# Patient Record
Sex: Female | Born: 1955 | Race: White | Hispanic: No | Smoking: Current some day smoker
Health system: Southern US, Community
[De-identification: ages and names within clinical notes are randomized; demographics above are authoritative.]

## PROBLEM LIST (undated history)

## (undated) DIAGNOSIS — F419 Anxiety disorder, unspecified: Secondary | ICD-10-CM

## (undated) DIAGNOSIS — I10 Essential (primary) hypertension: Secondary | ICD-10-CM

## (undated) DIAGNOSIS — M069 Rheumatoid arthritis, unspecified: Secondary | ICD-10-CM

## (undated) HISTORY — PX: ABDOMINAL HYSTERECTOMY: SHX81

## (undated) HISTORY — PX: WISDOM TOOTH EXTRACTION: SHX21

## (undated) HISTORY — PX: TONSILLECTOMY: SUR1361

## (undated) HISTORY — PX: BACK SURGERY: SHX140

## (undated) HISTORY — PX: ROTATOR CUFF REPAIR: SHX139

---

## 2014-10-12 ENCOUNTER — Emergency Department (HOSPITAL_BASED_OUTPATIENT_CLINIC_OR_DEPARTMENT_OTHER)
Admission: EM | Admit: 2014-10-12 | Discharge: 2014-10-12 | Disposition: A | Discharge status: Home / Self Care | Payer: PRIVATE HEALTH INSURANCE | Attending: Emergency Medicine | Admitting: Emergency Medicine

## 2014-10-12 ENCOUNTER — Encounter (HOSPITAL_BASED_OUTPATIENT_CLINIC_OR_DEPARTMENT_OTHER): Payer: Self-pay | Admitting: *Deleted

## 2014-10-12 ENCOUNTER — Emergency Department (HOSPITAL_BASED_OUTPATIENT_CLINIC_OR_DEPARTMENT_OTHER): Payer: PRIVATE HEALTH INSURANCE

## 2014-10-12 DIAGNOSIS — I10 Essential (primary) hypertension: Secondary | ICD-10-CM | POA: Diagnosis not present

## 2014-10-12 DIAGNOSIS — Z720 Tobacco use: Secondary | ICD-10-CM | POA: Diagnosis not present

## 2014-10-12 DIAGNOSIS — R238 Other skin changes: Secondary | ICD-10-CM

## 2014-10-12 DIAGNOSIS — Z8739 Personal history of other diseases of the musculoskeletal system and connective tissue: Secondary | ICD-10-CM | POA: Diagnosis not present

## 2014-10-12 DIAGNOSIS — R059 Cough, unspecified: Secondary | ICD-10-CM

## 2014-10-12 DIAGNOSIS — F419 Anxiety disorder, unspecified: Secondary | ICD-10-CM | POA: Diagnosis not present

## 2014-10-12 DIAGNOSIS — R55 Syncope and collapse: Secondary | ICD-10-CM | POA: Diagnosis not present

## 2014-10-12 DIAGNOSIS — Z79899 Other long term (current) drug therapy: Secondary | ICD-10-CM | POA: Diagnosis not present

## 2014-10-12 DIAGNOSIS — R229 Localized swelling, mass and lump, unspecified: Secondary | ICD-10-CM | POA: Insufficient documentation

## 2014-10-12 DIAGNOSIS — R05 Cough: Secondary | ICD-10-CM | POA: Insufficient documentation

## 2014-10-12 DIAGNOSIS — R110 Nausea: Secondary | ICD-10-CM | POA: Diagnosis present

## 2014-10-12 HISTORY — DX: Essential (primary) hypertension: I10

## 2014-10-12 HISTORY — DX: Rheumatoid arthritis, unspecified: M06.9

## 2014-10-12 HISTORY — DX: Anxiety disorder, unspecified: F41.9

## 2014-10-12 LAB — CBC WITH DIFFERENTIAL/PLATELET
BASOS ABS: 0 10*3/uL (ref 0.0–0.1)
BASOS PCT: 0 % (ref 0–1)
EOS ABS: 0.1 10*3/uL (ref 0.0–0.7)
EOS PCT: 1 % (ref 0–5)
HCT: 38.7 % (ref 36.0–46.0)
Hemoglobin: 12.9 g/dL (ref 12.0–15.0)
LYMPHS PCT: 11 % — AB (ref 12–46)
Lymphs Abs: 1.2 10*3/uL (ref 0.7–4.0)
MCH: 32.1 pg (ref 26.0–34.0)
MCHC: 33.3 g/dL (ref 30.0–36.0)
MCV: 96.3 fL (ref 78.0–100.0)
MONO ABS: 0.4 10*3/uL (ref 0.1–1.0)
MONOS PCT: 4 % (ref 3–12)
NEUTROS ABS: 9.2 10*3/uL — AB (ref 1.7–7.7)
Neutrophils Relative %: 84 % — ABNORMAL HIGH (ref 43–77)
Platelets: 300 10*3/uL (ref 150–400)
RBC: 4.02 MIL/uL (ref 3.87–5.11)
RDW: 12.2 % (ref 11.5–15.5)
WBC: 10.8 10*3/uL — ABNORMAL HIGH (ref 4.0–10.5)

## 2014-10-12 LAB — URINALYSIS, ROUTINE W REFLEX MICROSCOPIC
Bilirubin Urine: NEGATIVE
GLUCOSE, UA: NEGATIVE mg/dL
HGB URINE DIPSTICK: NEGATIVE
KETONES UR: 15 mg/dL — AB
LEUKOCYTES UA: NEGATIVE
NITRITE: NEGATIVE
PH: 8 (ref 5.0–8.0)
PROTEIN: NEGATIVE mg/dL
SPECIFIC GRAVITY, URINE: 1.018 (ref 1.005–1.030)
Urobilinogen, UA: 1 mg/dL (ref 0.0–1.0)

## 2014-10-12 LAB — BASIC METABOLIC PANEL
Anion gap: 3 — ABNORMAL LOW (ref 5–15)
BUN: 13 mg/dL (ref 6–23)
CO2: 29 mmol/L (ref 19–32)
CREATININE: 0.57 mg/dL (ref 0.50–1.10)
Calcium: 8.8 mg/dL (ref 8.4–10.5)
Chloride: 99 mmol/L (ref 96–112)
GFR calc Af Amer: >90 mL/min (ref >90)
GFR calc non Af Amer: >90 mL/min (ref >90)
GLUCOSE: 117 mg/dL — AB (ref 70–99)
POTASSIUM: 3.8 mmol/L (ref 3.5–5.1)
Sodium: 131 mmol/L — ABNORMAL LOW (ref 135–145)

## 2014-10-12 MED ORDER — SODIUM CHLORIDE 0.9 % IV BOLUS (SEPSIS)
1000.0000 mL | Freq: Once | INTRAVENOUS | Status: AC
  Administered 2014-10-12: 1000 mL via INTRAVENOUS

## 2014-10-12 NOTE — ED Notes (Addendum)
Pt reports she has had several episodes of feeling hot and flushed, clammy, nausea, since last night- last episode happened while waiting for triage- reports she has a headache and feels like she is going to pass out during the episodes

## 2014-10-12 NOTE — Discharge Instructions (Signed)
Maintaining her hydration, follow with your primary care doctor and obtain OB/GYN.   Please follow with your primary care doctor in the next 2 days for a check-up. They must obtain records for further management.   Do not hesitate to return to the Emergency Department for any new, worsening or concerning symptoms.

## 2014-10-12 NOTE — ED Provider Notes (Signed)
CSN: [id number]     Arrival date & time 10/12/14  1435 History   First MD Initiated Contact with Patient 10/12/14 1503     Chief Complaint  Patient presents with  . Nausea     (Consider location/radiation/quality/duration/timing/severity/associated sxs/prior Treatment) HPI   Tara Woodard is a 59 y.o. female past medical history significant for hypertension, anxiety and rheumatoid arthritis complaining of 4-5 episodes of lightheadedness, nausea, flushing lasting several seconds, she feels presyncopal but she is not pass out. The first episode was last night as she was going to bed, second episode was this afternoon while she was waiting in line at the drugstore. This is not related to standing up. Patient states that she recently started doing a infrared treatments for her rheumatoid arthritis, she's had 3 episodes this week. She is eating and drinking normally, she denies chest pain, shortness of breath, nausea, vomiting, change in bowel or bladder habits. On review of systems she notes a dry cough which she's had since December, attributes this to a persistent cold with rhinorrhea.  Past Medical History  Diagnosis Date  . Hypertension   . Anxiety   . RA (rheumatoid arthritis)    Past Surgical History  Procedure Laterality Date  . Back surgery    . Rotator cuff repair    . Abdominal hysterectomy    . Tonsillectomy    . Wisdom tooth extraction     No family history on file. History  Substance Use Topics  . Smoking status: Current Some Day Smoker    Types: Cigarettes  . Smokeless tobacco: Never Used  . Alcohol Use: 3.0 oz/week    5 Glasses of wine per week   OB History    No data available     Review of Systems  10 systems reviewed and found to be negative, except as noted in the HPI.   Allergies  Keflex and Augmentin  Home Medications   Prior to Admission medications   Medication Sig Start Date End Date Taking? Authorizing Provider   Amphetamine-Dextroamphetamine (ADDERALL PO) Take by mouth.   Yes Historical Provider, MD  busPIRone (BUSPAR) 10 MG tablet Take 10 mg by mouth daily.   Yes Historical Provider, MD  citalopram (CELEXA) 10 MG tablet Take 10 mg by mouth daily.   Yes Historical Provider, MD  lisinopril-hydrochlorothiazide (PRINZIDE,ZESTORETIC) 10-12.5 MG per tablet Take 1 tablet by mouth daily.   Yes Historical Provider, MD  OXYCODONE HCL PO Take 15 mg by mouth as needed.   Yes Historical Provider, MD   BP 109/63 mmHg  Pulse 78  Temp(Src) 98.4 F (36.9 C) (Oral)  Resp 14  Ht 5' 5.5" (1.664 m)  Wt 140 lb (63.504 kg)  BMI 22.93 kg/m2  SpO2 100% Physical Exam  Constitutional: She is oriented to person, place, and time. She appears well-developed and well-nourished. No distress.  HENT:  Head: Normocephalic and atraumatic.  Mouth/Throat: Oropharynx is clear and moist.  Eyes: Conjunctivae and EOM are normal. Pupils are equal, round, and reactive to light.  Neck: Normal range of motion.  Cardiovascular: Normal rate, regular rhythm and intact distal pulses.   Pulmonary/Chest: Effort normal and breath sounds normal. No stridor. No respiratory distress. She has no wheezes. She has no rales. She exhibits no tenderness.  Abdominal: Soft. Bowel sounds are normal. She exhibits no distension and no mass. There is no tenderness. There is no rebound and no guarding.  Musculoskeletal: Normal range of motion. She exhibits no edema or tenderness.  No  calf asymmetry, superficial collaterals, palpable cords, edema, Homans sign negative bilaterally.    Neurological: She is alert and oriented to person, place, and time.  Psychiatric: She has a normal mood and affect.  Nursing note and vitals reviewed.   ED Course  Procedures (including critical care time) Labs Review Labs Reviewed  CBC WITH DIFFERENTIAL/PLATELET - Abnormal; Notable for the following:    WBC 10.8 (*)    Neutrophils Relative % 84 (*)    Neutro Abs 9.2 (*)     Lymphocytes Relative 11 (*)    All other components within normal limits  BASIC METABOLIC PANEL - Abnormal; Notable for the following:    Sodium 131 (*)    Glucose, Bld 117 (*)    Anion gap 3 (*)    All other components within normal limits  URINALYSIS, ROUTINE W REFLEX MICROSCOPIC - Abnormal; Notable for the following:    Ketones, ur 15 (*)    All other components within normal limits  URINE CULTURE    Imaging Review Dg Chest 2 View  10/12/2014   CLINICAL DATA:  Several episodes of feeling hot, flushed, clammy, nauseated since last night. History of hypertension.  EXAM: CHEST  2 VIEW  COMPARISON:  None.  FINDINGS: The heart size and mediastinal contours are within normal limits. Both lungs are clear. The visualized skeletal structures are unremarkable.  IMPRESSION: No active cardiopulmonary disease.   Electronically Signed   By: Norva PavlovElizabeth  Brown M.D.   On: 10/12/2014 16:22     EKG Interpretation None      MDM   Final diagnoses:  Cough  Localized warmth of skin  Pre-syncope    Filed Vitals:   10/12/14 1524 10/12/14 1525 10/12/14 1525 10/12/14 1816  BP: 118/67 118/80 106/69 109/63  Pulse: 80 81 87 78  Temp:      TempSrc:      Resp:    14  Height:      Weight:      SpO2:    100%    Medications  sodium chloride 0.9 % bolus 1,000 mL (0 mLs Intravenous Stopped 10/12/14 1902)    Tara PartridgeKimberly M Woodard is a pleasant 59 y.o. female presenting with episodes of extreme warmth and flushing, HA and nausea with presyncopal sensation. No chest pain/sob/LOC. EKG with LBBB, no prior for comparison, Pt has had several episodes in ED and lasting a few minutes with flushing to face and upper chest. Review of cardiac monitoring during 2 episodes with no abnormalities (reviewed with attending). Pt has had hysterectomy, but may be perimenopausal with hot flashes. Extensive discussion of return precautions and Pt verbalizes understanding. Will follow with pcp.   Discussed case with attending  MD who agrees with plan and stability to d/c to home.   Evaluation does not show pathology that would require ongoing emergent intervention or inpatient treatment. Pt is hemodynamically stable and mentating appropriately. Discussed findings and plan with patient/guardian, who agrees with care plan. All questions answered. Return precautions discussed and outpatient follow up given.   Discharge Medication List as of 10/12/2014  6:36 PM           Wynetta Emeryicole Abagael Kramm, PA-C 10/13/14 1702  Juliet RudeNathan R. Rubin PayorPickering, MD 10/14/14 2133

## 2014-10-13 LAB — URINE CULTURE: Colony Count: 10000

## 2015-10-21 IMAGING — DX DG CHEST 2V
2 series · 2 of 2 positions shown · non-contrast
Comparison: None.

CLINICAL DATA: Several episodes of feeling hot, flushed, clammy,
nauseated since last night. History of hypertension.

EXAM:
CHEST  2 VIEW

[chest pa]
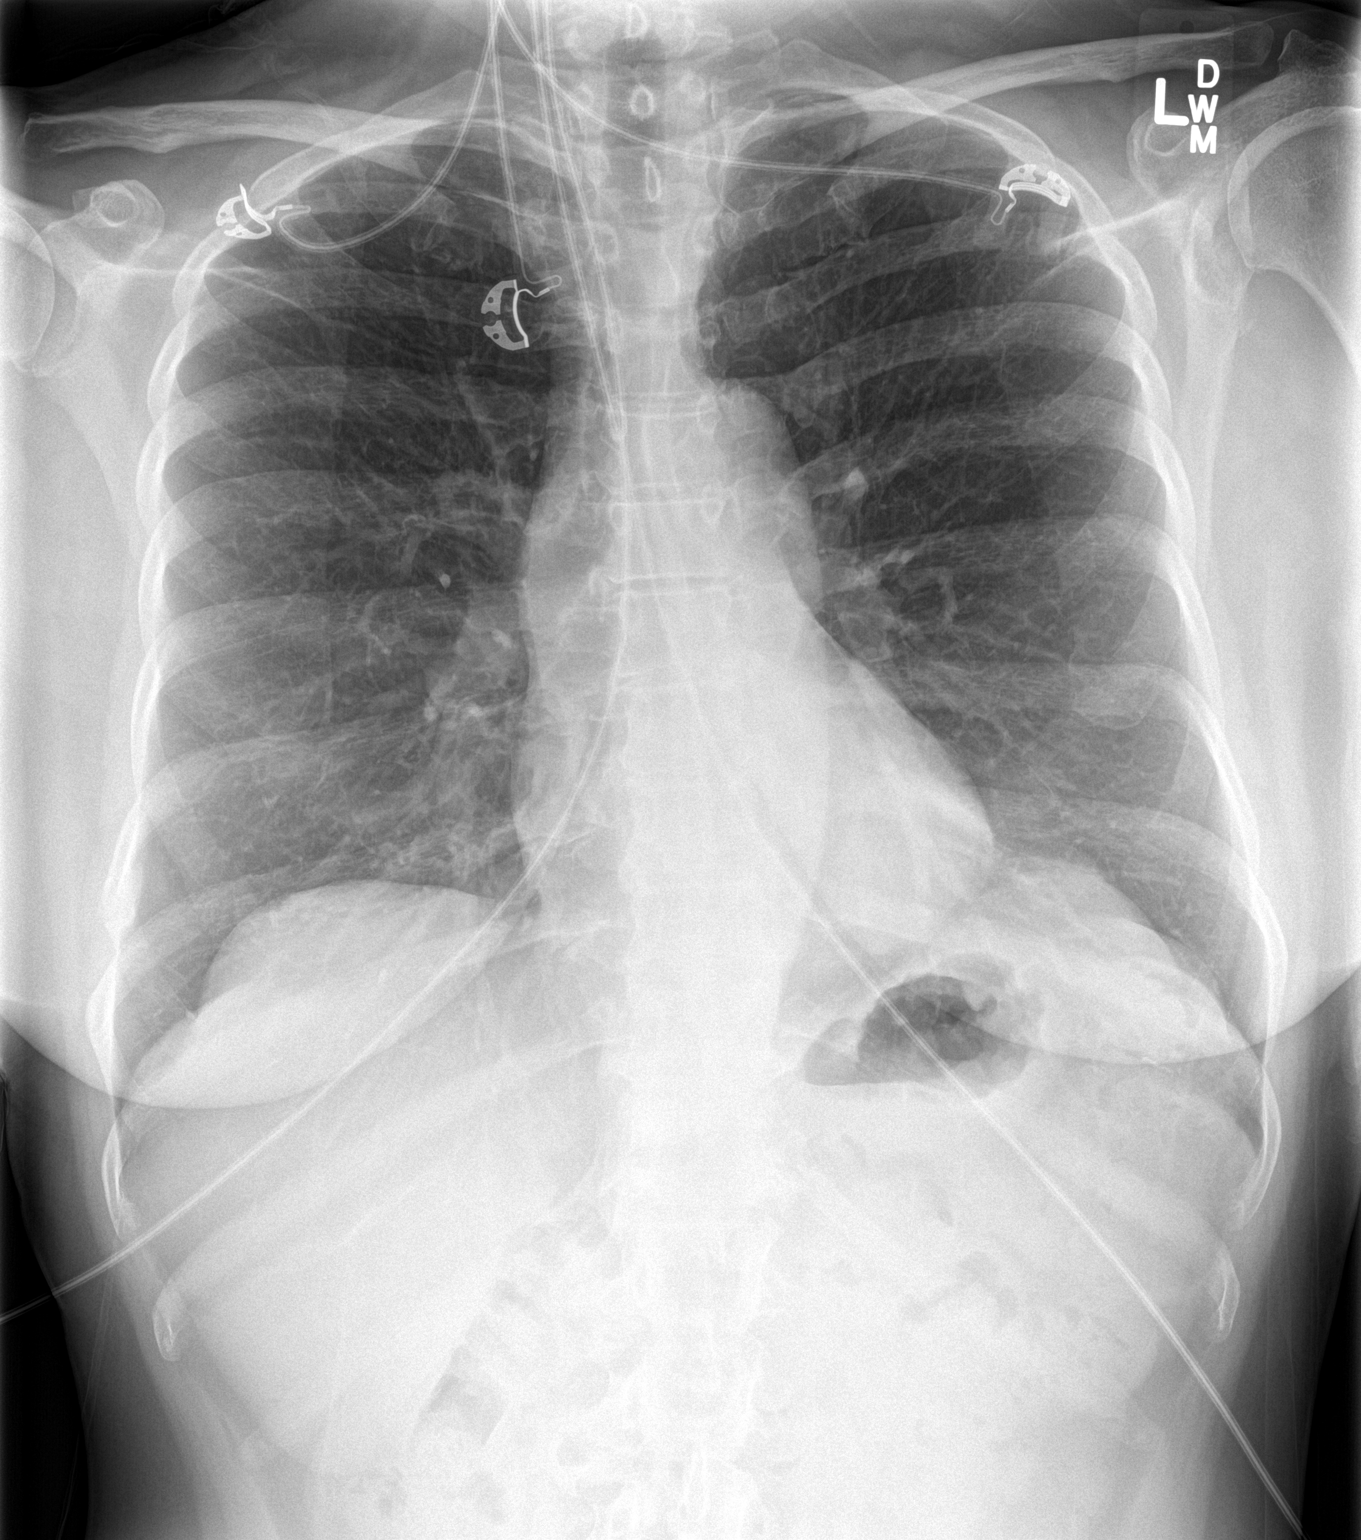

[chest lat]
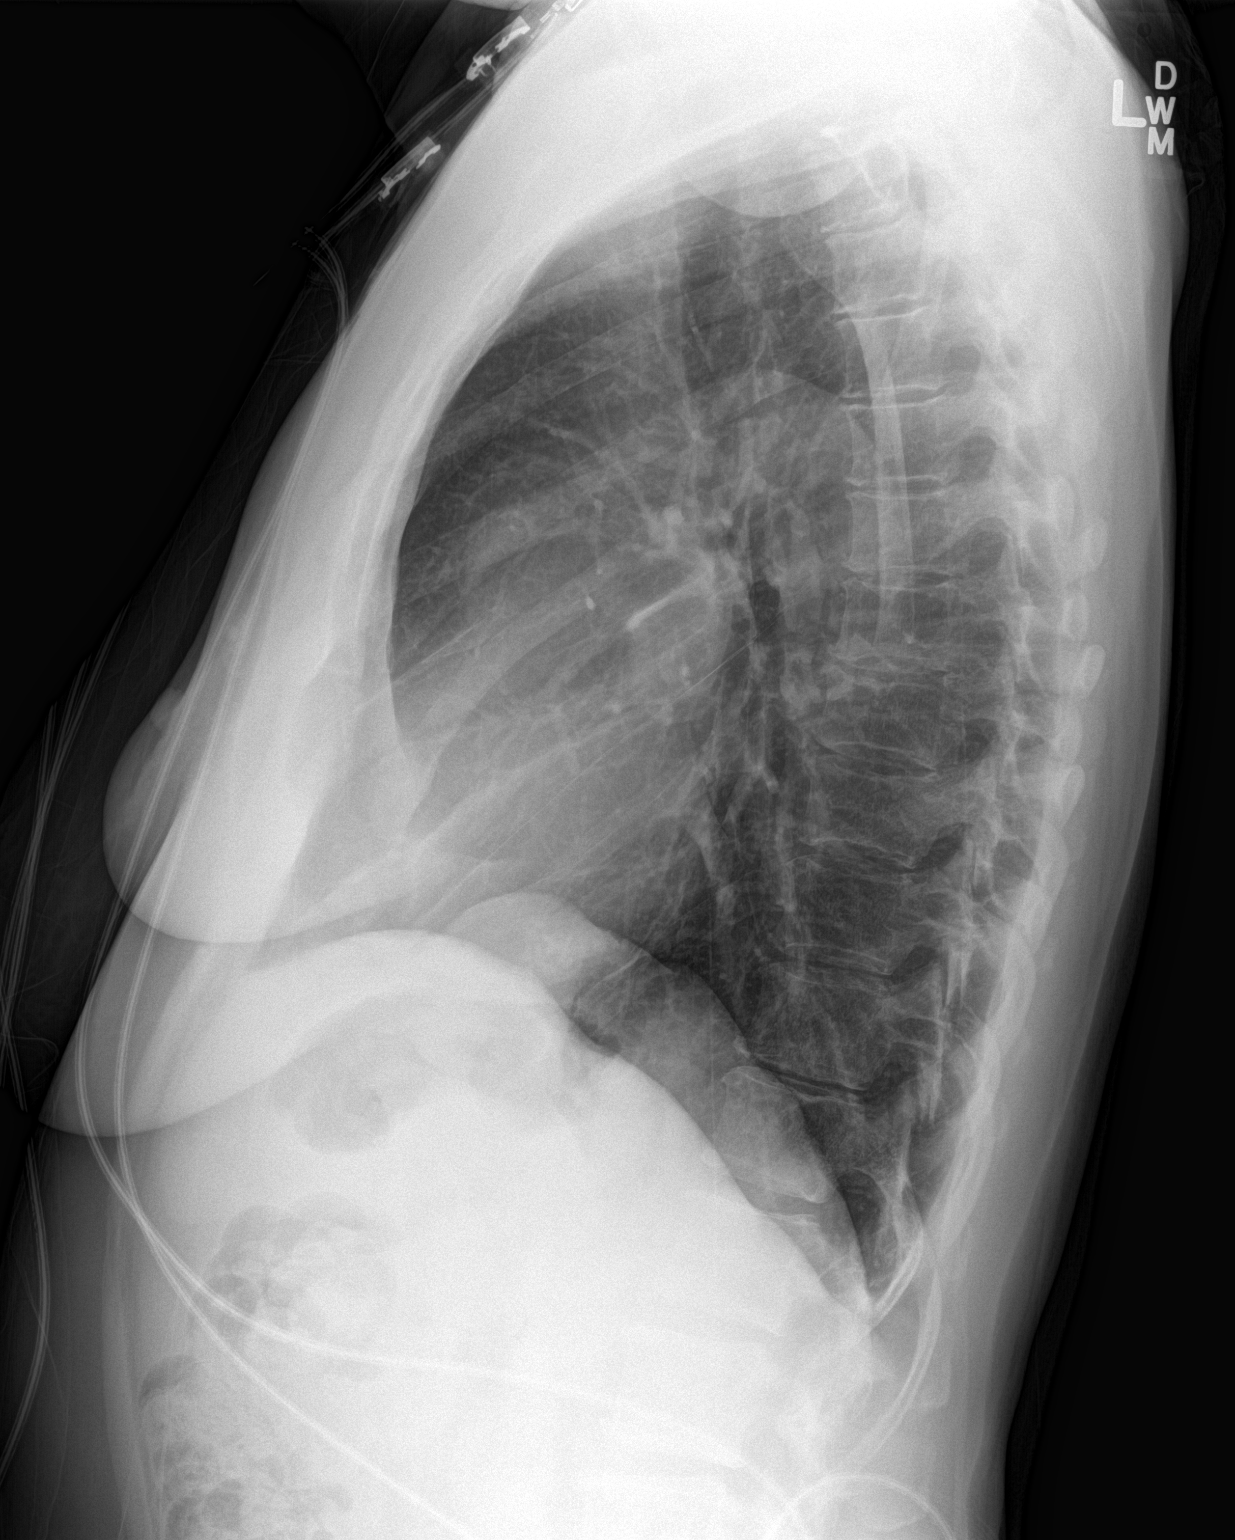

[2 of 2 positions shown; findings below may reference images not displayed]

FINDINGS: The heart size and mediastinal contours are within normal limits.
Both lungs are clear. The visualized skeletal structures are
unremarkable.
IMPRESSION: No active cardiopulmonary disease.

## 2017-09-15 DEATH — deceased

## 2017-10-13 DEATH — deceased

## 2017-11-13 DEATH — deceased
